# Patient Record
Sex: Female | Born: 1975 | Race: White | Hispanic: No | Marital: Single | Smoking: Never smoker
Health system: Southern US, Community
[De-identification: ages and names within clinical notes are randomized; demographics above are authoritative.]

## PROBLEM LIST (undated history)

## (undated) HISTORY — PX: CHOLECYSTECTOMY: SHX55

---

## 2001-04-19 ENCOUNTER — Other Ambulatory Visit: Admission: RE | Admit: 2001-04-19 | Discharge: 2001-04-19 | Payer: Self-pay | Admitting: *Deleted

## 2004-02-24 ENCOUNTER — Inpatient Hospital Stay (HOSPITAL_COMMUNITY): Admission: AD | Admit: 2004-02-24 | Discharge: 2004-02-24 | Payer: Self-pay | Admitting: Obstetrics & Gynecology

## 2005-04-14 ENCOUNTER — Inpatient Hospital Stay (HOSPITAL_COMMUNITY): Admission: AD | Admit: 2005-04-14 | Discharge: 2005-04-18 | Payer: Self-pay | Admitting: Obstetrics and Gynecology

## 2006-06-20 ENCOUNTER — Inpatient Hospital Stay (HOSPITAL_COMMUNITY): Admission: AD | Admit: 2006-06-20 | Discharge: 2006-06-20 | Payer: Self-pay | Admitting: *Deleted

## 2006-09-19 ENCOUNTER — Inpatient Hospital Stay (HOSPITAL_COMMUNITY): Admission: RE | Admit: 2006-09-19 | Discharge: 2006-09-21 | Payer: Self-pay | Admitting: Obstetrics and Gynecology

## 2006-09-25 ENCOUNTER — Inpatient Hospital Stay (HOSPITAL_COMMUNITY): Admission: AD | Admit: 2006-09-25 | Discharge: 2006-09-25 | Payer: Self-pay | Admitting: Obstetrics and Gynecology

## 2010-07-21 ENCOUNTER — Encounter: Payer: Self-pay | Admitting: Ophthalmology

## 2018-02-03 ENCOUNTER — Emergency Department (HOSPITAL_COMMUNITY): Payer: BLUE CROSS/BLUE SHIELD

## 2018-02-03 ENCOUNTER — Encounter (HOSPITAL_COMMUNITY): Payer: Self-pay | Admitting: Emergency Medicine

## 2018-02-03 ENCOUNTER — Other Ambulatory Visit: Payer: Self-pay

## 2018-02-03 ENCOUNTER — Emergency Department (HOSPITAL_COMMUNITY)
Admission: EM | Admit: 2018-02-03 | Discharge: 2018-02-03 | Disposition: A | Discharge status: Home / Self Care | Payer: BLUE CROSS/BLUE SHIELD | Attending: Emergency Medicine | Admitting: Emergency Medicine

## 2018-02-03 DIAGNOSIS — W5501XA Bitten by cat, initial encounter: Secondary | ICD-10-CM | POA: Diagnosis not present

## 2018-02-03 DIAGNOSIS — S80872A Other superficial bite, left lower leg, initial encounter: Secondary | ICD-10-CM | POA: Diagnosis not present

## 2018-02-03 DIAGNOSIS — S40872A Other superficial bite of left upper arm, initial encounter: Secondary | ICD-10-CM | POA: Diagnosis not present

## 2018-02-03 DIAGNOSIS — Y999 Unspecified external cause status: Secondary | ICD-10-CM | POA: Insufficient documentation

## 2018-02-03 DIAGNOSIS — Y939 Activity, unspecified: Secondary | ICD-10-CM | POA: Insufficient documentation

## 2018-02-03 DIAGNOSIS — Y929 Unspecified place or not applicable: Secondary | ICD-10-CM | POA: Insufficient documentation

## 2018-02-03 MED ORDER — CLINDAMYCIN HCL 150 MG PO CAPS: 450.0000 mg | ORAL_CAPSULE | Freq: Three times a day (TID) | ORAL | 0 refills | Status: DC

## 2018-02-03 MED ORDER — CIPROFLOXACIN HCL 500 MG PO TABS
500.0000 mg | ORAL_TABLET | Freq: Once | ORAL | Status: AC
  Administered 2018-02-03: 500 mg via ORAL
  Filled 2018-02-03: qty 1

## 2018-02-03 MED ORDER — TETANUS-DIPHTH-ACELL PERTUSSIS 5-2.5-18.5 LF-MCG/0.5 IM SUSP
0.5000 mL | Freq: Once | INTRAMUSCULAR | Status: AC
  Administered 2018-02-03: 0.5 mL via INTRAMUSCULAR
  Filled 2018-02-03: qty 0.5

## 2018-02-03 MED ORDER — CLINDAMYCIN HCL 150 MG PO CAPS
450.0000 mg | ORAL_CAPSULE | Freq: Once | ORAL | Status: AC
  Administered 2018-02-03: 450 mg via ORAL
  Filled 2018-02-03: qty 3

## 2018-02-03 MED ORDER — CIPROFLOXACIN HCL 500 MG PO TABS: 500.0000 mg | ORAL_TABLET | Freq: Two times a day (BID) | ORAL | 0 refills | Status: DC

## 2018-02-03 NOTE — ED Provider Notes (Signed)
[facility] Provider Note   CSN: [id number] Arrival date & time: 02/03/18  2209     History   Chief Complaint Chief Complaint  Patient presents with  . Animal Bite    HPI Melinda David is a 42 y.o. female with a hx of cholecystectomy who presents to the ED with complaints of cat bite to LUE/LLE which occurred this evening. Patient states that she recently adopted a kitten who's vaccinations including rabies are UTD. She states that the cat becomes irritable when lifted off of the ground, she forgot this and lifted the cat, and it subsequently bit and scratched her LUE and then her LLE when she placed it back onto the ground. Mild discomfort to the L hand (thenar eminence), otherwise non painful, no specific alleviating/aggravating factors. Denies fever, chills, numbness, or weakness.  HPI  History reviewed. No pertinent past medical history.  There are no active problems to display for this patient.   Past Surgical History:  Procedure Laterality Date  . CESAREAN SECTION    . CHOLECYSTECTOMY       OB History   None      Home Medications    Prior to Admission medications   Not on File    Family History No family history on file.  Social History Social History   Tobacco Use  . Smoking status: Never Smoker  . Smokeless tobacco: Never Used  Substance Use Topics  . Alcohol use: Yes  . Drug use: Yes    Types: Marijuana     Allergies   Penicillins   Review of Systems Review of Systems  Constitutional: Negative for chills and fever.  Skin: Positive for wound.  Neurological: Negative for weakness and numbness.     Physical Exam Updated Vital Signs BP (!) 151/106 (BP Location: Right Arm)   Pulse 77   Temp 98.8 F (37.1 C) (Oral)   Resp 16   Wt 95.3 kg (210 lb)   LMP 01/11/2018 (Exact Date)   SpO2 99%   Physical Exam  Constitutional: She appears well-developed and well-nourished. No distress.  HENT:  Head:  Normocephalic and atraumatic.  Eyes: Conjunctivae are normal. Right eye exhibits no discharge. Left eye exhibits no discharge.  Cardiovascular:  Pulses:      Radial pulses are 2+ on the right side, and 2+ on the left side.       Dorsalis pedis pulses are 2+ on the right side, and 2+ on the left side.       Posterior tibial pulses are 2+ on the right side, and 2+ on the left side.  Musculoskeletal:  Please see skin exam for further details.  No appreciable swelling, spreading erythema, ecchymosis, or increased warmth.  Patient has normal range of motion to bilateral elbows, wrists, knees, ankles, and all digits.  Extremities are without focal bony tenderness to palpation.  Neurological: She is alert.  Clear speech.  Sensation grossly intact bilateral upper and lower extremities.  5 out of 5 symmetric grip strength.  5 out of 5 strength with plantar dorsiflexion bilaterally.  Skin: Skin is warm and dry. Capillary refill takes less than 2 seconds.  Left upper extremity: Patient has multiple puncture wounds that appear fairly superficial to the dorsal and ventral aspects of the left forearm.   There is a fairly superficial appearing scratch/laceration to the thenar eminence of the left hand.  Left lower extremity with a few puncture wounds appear fairly superficial, as well as a  somewhat thicker but still superficial laceration/scratch to the medial distal lower leg.  Wounds each have no significant surrounding erythema or warmth.  No purulent drainage. Pictured below.   Psychiatric: She has a normal mood and affect. Her behavior is normal. Thought content normal.  Nursing note and vitals reviewed.          ED Treatments / Results  Labs (all labs ordered are listed, but only abnormal results are displayed) Labs Reviewed - No data to display  EKG None  Radiology No results found.  Procedures Procedures (including critical care time)  Medications Ordered in ED Medications  Tdap  (BOOSTRIX) injection 0.5 mL (0.5 mLs Intramuscular Given 02/03/18 2336)  ciprofloxacin (CIPRO) tablet 500 mg (500 mg Oral Given 02/03/18 2336)  clindamycin (CLEOCIN) capsule 450 mg (450 mg Oral Given 02/03/18 2336)     Initial Impression / Assessment and Plan / ED Course  I have reviewed the triage vital signs and the nursing notes.  Pertinent labs & imaging results that were available during my care of the patient were reviewed by me and considered in my medical decision making (see chart for details).   Patient presents to the ED with multiple cat scratch/bite wounds to LUE/LLE. Patient nontoxic appearing, resting comfortably, vitals WNL with the exception of elevated BP, doubt HTN emergency, patient aware of need for PCP recheck. X-rays obtained- no evidence of FB or underlying fracture/dislocation, NVI distally. Betadine with pressure irrigation performed for cleaning, wound visualized in bloodless field, appear fairly superficial overall. Animal has been rabies vaccinated. Tetanus updated at today's visit. Will place patient on Clindamycin and Ciprofloxacin combination due to PCN allergy to assist in preventing infection, wounds do appear grossly infected at present evaluation. I discussed results, treatment plan, need for PCP follow-up, and return precautions with the patient. Provided opportunity for questions, patient confirmed understanding and is in agreement with plan.    Final Clinical Impressions(s) / ED Diagnoses   Final diagnoses:  Cat bite, initial encounter    ED Discharge Orders        Ordered    ciprofloxacin (CIPRO) 500 MG tablet  Every 12 hours     02/03/18 2333    clindamycin (CLEOCIN) 150 MG capsule  3 times daily     02/03/18 2333       Petrucelli, Roscoe R, PA-C 02/03/18 2346    Charlynne Pander, MD 02/04/18 [phone]

## 2018-02-03 NOTE — ED Notes (Signed)
See EDP assessment 

## 2018-02-03 NOTE — ED Triage Notes (Signed)
Patient from home, got a cat from friend, up to date in shots, was bitten left anterior forearm and left medial ankle.  Bleeding controlled.

## 2018-02-03 NOTE — ED Notes (Signed)
Pt verbalizes understanding of d/c instructions. Pt to pick up prescriptions. Pt ambulatory at d/c with all belongings.   

## 2018-02-03 NOTE — ED Notes (Signed)
Patient transported to X-ray 

## 2018-02-03 NOTE — Discharge Instructions (Addendum)
You were seen in the emergency department today due to injuries from cat bite/scratches.  The x-rays did not show any underlying broken bones or remaining foreign bodies in the skin.  Your tetanus was updated at today's visit.  We are placing you on antibiotics in order to help prevent infection as infection often occurs with cat bite type injuries.  We are placing you on clindamycin as well as ciprofloxacin. Please take all of your antibiotics until finished. You may develop abdominal discomfort or diarrhea from the antibiotic.  You may help offset this with probiotics which you can buy at the store (ask your pharmacist if unable to find) or get probiotics in the form of eating yogurt. Do not eat or take the probiotics until 2 hours after your antibiotic. If you are unable to tolerate these side effects follow-up with your primary care provider or return to the emergency department.   If you begin to experience any blistering, rashes, swelling, or difficulty breathing seek medical care for evaluation of potentially more serious side effects.   Please be aware that this medication may interact with other medications you are taking, please be sure to discuss your medication list with your pharmacist.   Please keep the wounds clean and dry as best possible.  Tylenol and/or Motrin per over-the-counter dosing for any continued discomfort related to these injuries.  Follow-up closely with your primary care provider in 3 days for reevaluation of these areas.  Return to the ER for new or worsening symptoms including but not limited limited to surrounding redness, purulent (pus) type drainage from the wounds, fever, difficulty moving your extremities in any of the joints, or any other concerns that you may have.   Additionally have your blood pressure rechecked by primary care provider as it was elevated in the emergency department today. Vitals:   02/03/18 2230 02/03/18 2256  BP: (!) 151/106 (!) 145/98    Pulse: 77 77  Resp: 16 16  Temp: 98.8 F (37.1 C) 98.6 F (37 C)  SpO2: 99% 100%

## 2018-07-13 ENCOUNTER — Encounter (HOSPITAL_COMMUNITY): Payer: Self-pay

## 2018-07-13 ENCOUNTER — Ambulatory Visit (HOSPITAL_COMMUNITY)
Admission: EM | Admit: 2018-07-13 | Discharge: 2018-07-13 | Disposition: A | Discharge status: Home / Self Care | Payer: BLUE CROSS/BLUE SHIELD | Attending: Emergency Medicine | Admitting: Emergency Medicine

## 2018-07-13 ENCOUNTER — Other Ambulatory Visit: Payer: Self-pay

## 2018-07-13 DIAGNOSIS — R6889 Other general symptoms and signs: Secondary | ICD-10-CM | POA: Diagnosis not present

## 2018-07-13 MED ORDER — OSELTAMIVIR PHOSPHATE 75 MG PO CAPS: 75.0000 mg | ORAL_CAPSULE | Freq: Two times a day (BID) | ORAL | 0 refills | Status: AC

## 2018-07-13 NOTE — ED Triage Notes (Signed)
Pt cc she has body aches, headaches and chills fever. X 1 day

## 2018-07-13 NOTE — Discharge Instructions (Signed)
Get plenty of rest and push fluids Tamiflu prescribed.  Take as directed and to completion Use OTC medications like ibuprofen or tylenol as needed fever or pain Follow up with PCP or with Joaquin Courts FNP if symptoms persist Return or go to ER if you have any new or worsening symptoms fever, chills, nausea, vomiting, chest pain, cough, shortness of breath, wheezing, abdominal pain, changes in bowel or bladder habits, etc..Marland Kitchen

## 2018-07-13 NOTE — ED Provider Notes (Signed)
[facility]   [id number] 07/13/18 Arrival Time: 0808   CC: Flu - like symptoms   SUBJECTIVE: History from: patient.  Melinda David is a 43 y.o. female who presents with abrupt onset of headache, body aches, chills, fever, and chest congestion x 1 day.  Tmax 101 this morning at home, 99 in office.  Admits to positive sick exposure at work, unsure if exposed to the flu.  Has tried tylenol with relief.  Reports previous symptoms in the past and diagnosed with the flu.  Complains of associated fatigue. Denies sinus pain, rhinorrhea, sore throat, SOB, wheezing, chest pain, nausea, changes in bowel or bladder habits.    Received flu shot this year: no.  ROS: As per HPI.  History reviewed. No pertinent past medical history. Past Surgical History:  Procedure Laterality Date  . CESAREAN SECTION    . CHOLECYSTECTOMY     Allergies  Allergen Reactions  . Penicillins    No current facility-administered medications on file prior to encounter.    No current outpatient medications on file prior to encounter.   Social History   Socioeconomic History  . Marital status: Single    Spouse name: Not on file  . Number of children: Not on file  . Years of education: Not on file  . Highest education level: Not on file  Occupational History  . Not on file  Social Needs  . Financial resource strain: Not on file  . Food insecurity:    Worry: Not on file    Inability: Not on file  . Transportation needs:    Medical: Not on file    Non-medical: Not on file  Tobacco Use  . Smoking status: Never Smoker  . Smokeless tobacco: Never Used  Substance and Sexual Activity  . Alcohol use: Yes  . Drug use: Yes    Types: Marijuana  . Sexual activity: Not on file  Lifestyle  . Physical activity:    Days per week: Not on file    Minutes per session: Not on file  . Stress: Not on file  Relationships  . Social connections:    Talks on phone: Not on file    Gets together: Not on file    Attends religious service: Not on file    Active member of club or organization: Not on file    Attends meetings of clubs or organizations: Not on file    Relationship status: Not on file  . Intimate partner violence:    Fear of current or ex partner: Not on file    Emotionally abused: Not on file    Physically abused: Not on file    Forced sexual activity: Not on file  Other Topics Concern  . Not on file  Social History Narrative  . Not on file   History reviewed. No pertinent family history.  OBJECTIVE:  Vitals:   07/13/18 0832 07/13/18 0838  BP: 132/84   Pulse: 88   Resp: 16   Temp: 99.1 F (37.3 C)   TempSrc: Oral   SpO2: 95%   Weight:  220 lb (99.8 kg)     General appearance: alert; appears fatigued, but nontoxic; speaking in full sentences and tolerating own secretions HEENT: NCAT; Ears: EACs clear, TMs pearly gray; Eyes: PERRL.  EOM grossly intact. Nose: nares patent without rhinorrhea, Throat: oropharynx clear, tonsils non erythematous or enlarged, uvula midline  Neck: supple without LAD Lungs: unlabored respirations, symmetrical air entry; cough: absent; no respiratory distress; CTAB Heart:  regular rate and rhythm.  Radial pulses 2+ symmetrical bilaterally Skin: warm and dry Psychological: alert and cooperative; normal mood and affect  ASSESSMENT & PLAN:  1. Flu-like symptoms     Meds ordered this encounter  Medications  . oseltamivir (TAMIFLU) 75 MG capsule    Sig: Take 1 capsule (75 mg total) by mouth every 12 (twelve) hours.    Dispense:  10 capsule    Refill:  0    Order Specific Question:   Supervising Provider    Answer:   Eustace MooreELSON, YVONNE SUE [[id number]]    Get plenty of rest and push fluids Tamiflu prescribed.  Take as directed and to completion Use OTC medications like ibuprofen or tylenol as needed fever or pain Follow up with PCP or with Joaquin CourtsKimberly Harris FNP if symptoms persist Return or go to ER if you have any new or worsening symptoms fever,  chills, nausea, vomiting, chest pain, cough, shortness of breath, wheezing, abdominal pain, changes in bowel or bladder habits, etc...  Reviewed expectations re: course of current medical issues. Questions answered. Outlined signs and symptoms indicating need for more acute intervention. Patient verbalized understanding. After Visit Summary given.         Rennis HardingWurst, Treana Lacour, PA-C 07/13/18 [phone]

## 2019-03-05 IMAGING — CR DG TIBIA/FIBULA 2V*L*
4 series · 4 of 4 positions shown · non-contrast
Comparison: None.

CLINICAL DATA: Cat bite wounds at the left leg, acute onset.
Initial encounter.

EXAM:
LEFT TIBIA AND FIBULA - 2 VIEW

[tibia ap (1 of 2)]
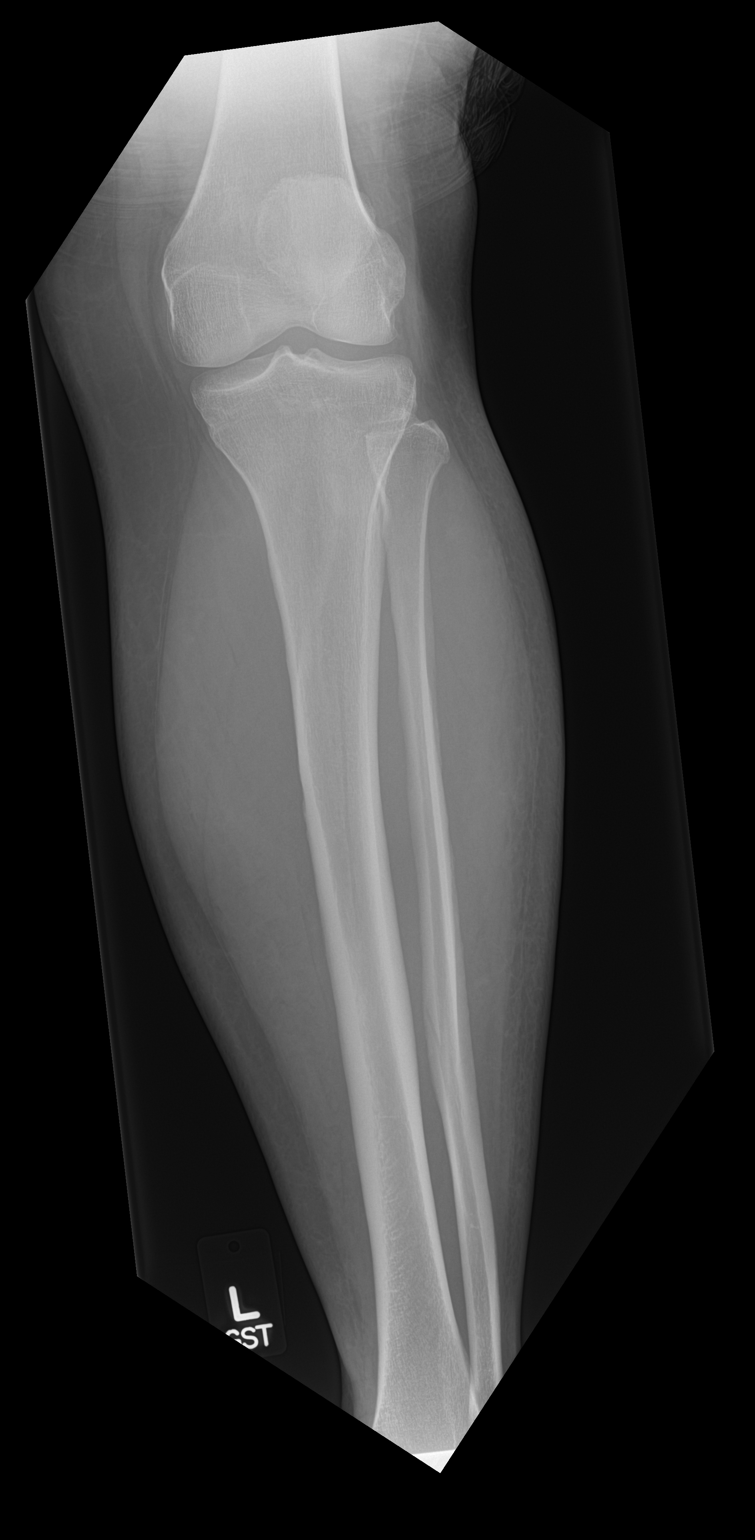

[tibia ap (2 of 2)]
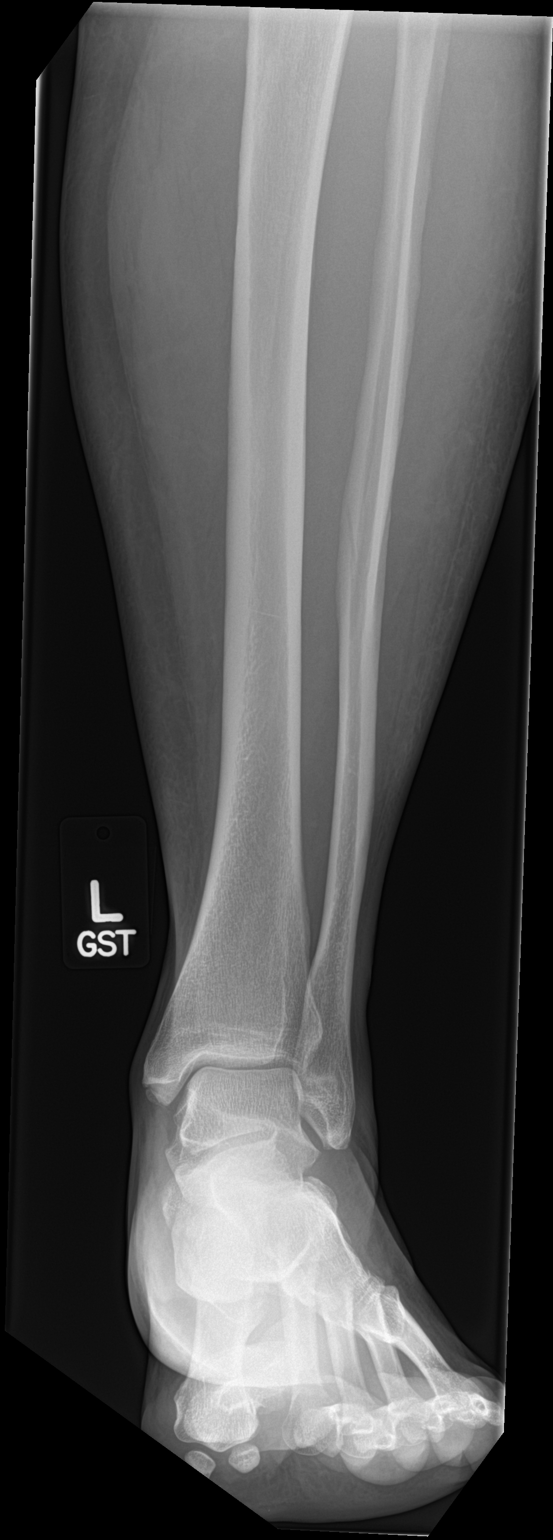

[tibia lat (1 of 2)]
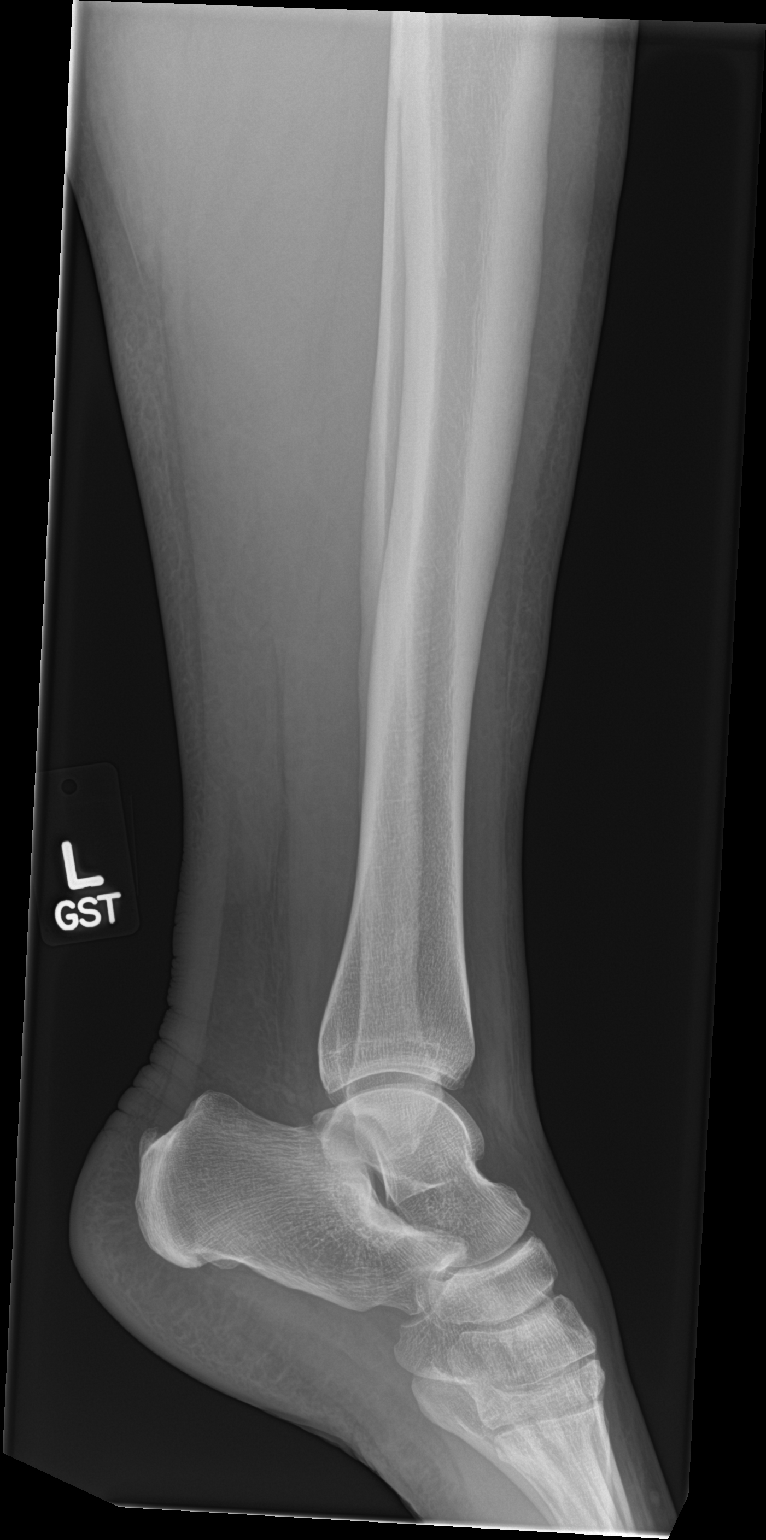

[tibia lat (2 of 2)]
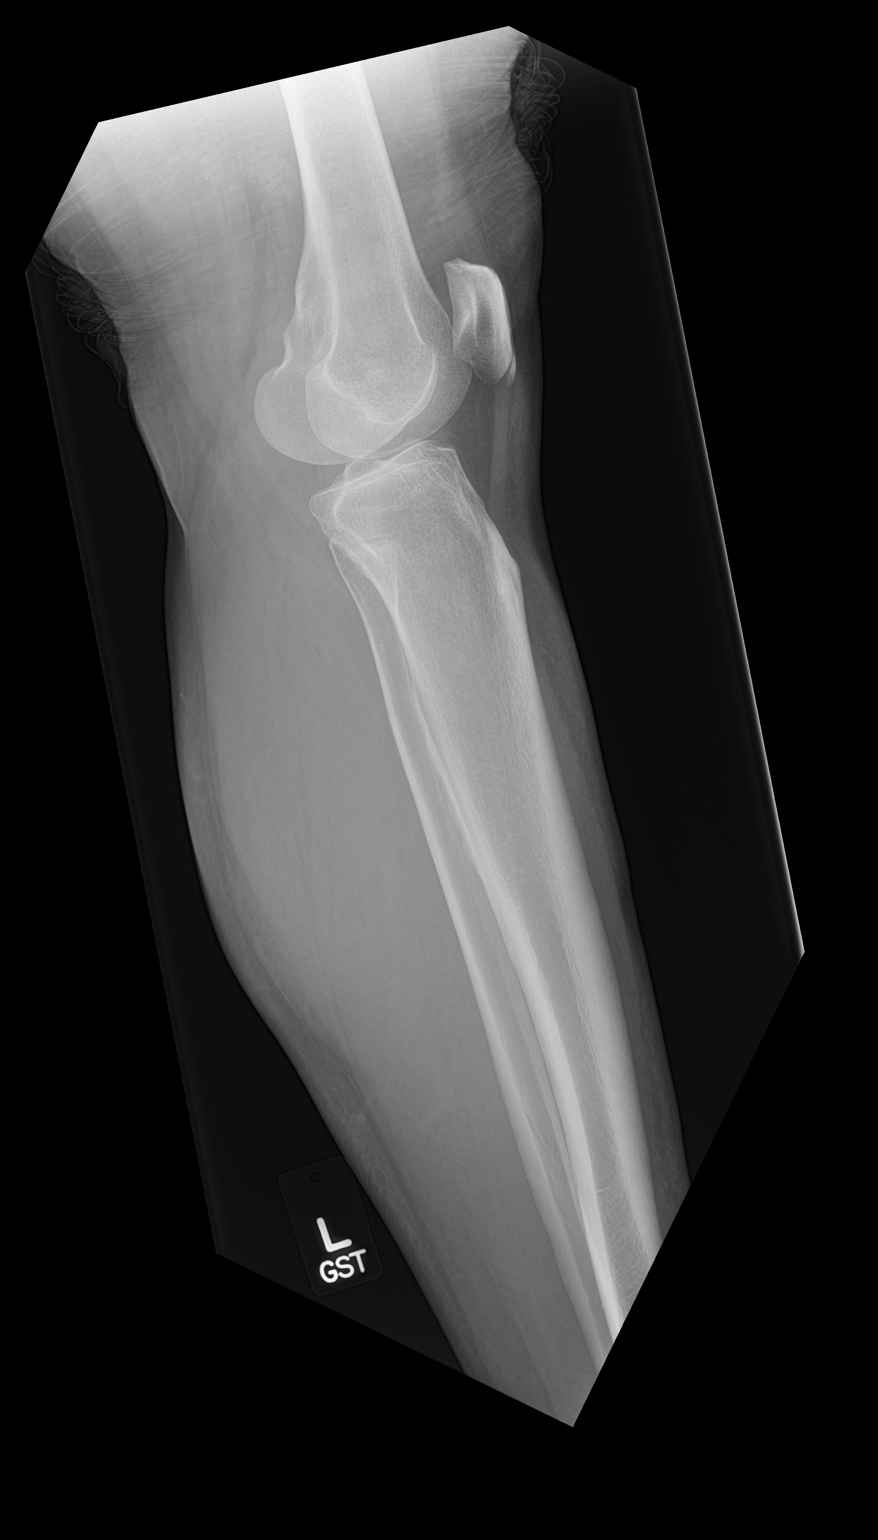

[4 of 4 positions shown; findings below may reference images not displayed]

FINDINGS: The known cat bite wounds are not well characterized on radiograph.
No radiopaque foreign bodies are seen. There is no evidence of
osseous disruption. Visualized joint spaces are preserved. The ankle
mortise is grossly unremarkable. The knee joint is unremarkable. No
knee joint effusion is identified.
IMPRESSION: No evidence of osseous disruption. No radiopaque foreign bodies
seen.
